# Patient Record
Sex: Male | Born: 1950 | Race: White | Hispanic: No | Marital: Married | Smoking: Current every day smoker
Health system: Southern US, Community
[De-identification: ages and names within clinical notes are randomized; demographics above are authoritative.]

---

## 2014-10-27 ENCOUNTER — Other Ambulatory Visit: Payer: Self-pay | Admitting: Family Medicine

## 2014-10-27 ENCOUNTER — Ambulatory Visit
Admission: RE | Admit: 2014-10-27 | Discharge: 2014-10-27 | Disposition: A | Discharge status: Home / Self Care | Payer: BC Managed Care – PPO | Source: Ambulatory Visit | Attending: Family Medicine | Admitting: Family Medicine

## 2014-10-27 DIAGNOSIS — R05 Cough: Secondary | ICD-10-CM

## 2014-10-27 DIAGNOSIS — R059 Cough, unspecified: Secondary | ICD-10-CM

## 2017-02-05 ENCOUNTER — Telehealth: Payer: Self-pay | Admitting: Acute Care

## 2017-02-05 DIAGNOSIS — F17210 Nicotine dependence, cigarettes, uncomplicated: Principal | ICD-10-CM

## 2017-02-06 NOTE — Telephone Encounter (Signed)
[facility]

## 2017-02-06 NOTE — Telephone Encounter (Signed)
[facility] with pt [facility]22/[facility] 3:00 CT ordered Nothing further needed

## 2017-02-22 ENCOUNTER — Ambulatory Visit (INDEPENDENT_AMBULATORY_CARE_PROVIDER_SITE_OTHER): Payer: BLUE CROSS/BLUE SHIELD | Admitting: Acute Care

## 2017-02-22 ENCOUNTER — Ambulatory Visit (INDEPENDENT_AMBULATORY_CARE_PROVIDER_SITE_OTHER)
Admission: RE | Admit: 2017-02-22 | Discharge: 2017-02-22 | Disposition: A | Discharge status: Home / Self Care | Payer: BLUE CROSS/BLUE SHIELD | Source: Ambulatory Visit | Attending: Acute Care | Admitting: Acute Care

## 2017-02-22 ENCOUNTER — Encounter: Payer: Self-pay | Admitting: Acute Care

## 2017-02-22 DIAGNOSIS — Z87891 Personal history of nicotine dependence: Secondary | ICD-10-CM | POA: Diagnosis not present

## 2017-02-22 DIAGNOSIS — F17210 Nicotine dependence, cigarettes, uncomplicated: Secondary | ICD-10-CM

## 2017-02-22 NOTE — Progress Notes (Signed)
Shared Decision Making Visit Lung Cancer Screening Program [phone])   Eligibility:  Age 66 y.o.  Pack Years Smoking History Calculation 43 pack year (# packs/per year x # years smoked)  Recent History of coughing up blood  no  Unexplained weight loss? no ( >Than 15 pounds within the last 6 months )  Prior History Lung / other cancer no (Diagnosis within the last 5 years already requiring surveillance chest CT Scans).  Smoking Status Current Smoker  Former Smokers: Years since quit: NA  Quit Date: NA  Visit Components:  Discussion included one or more decision making aids. yes  Discussion included risk/benefits of screening. yes  Discussion included potential follow up diagnostic testing for abnormal scans. yes  Discussion included meaning and risk of over diagnosis. yes  Discussion included meaning and risk of False Positives. yes  Discussion included meaning of total radiation exposure. yes  Counseling Included:  Importance of adherence to annual lung cancer LDCT screening. yes  Impact of comorbidities on ability to participate in the program. yes  Ability and willingness to under diagnostic treatment. yes  Smoking Cessation Counseling:  Current Smokers:   Discussed importance of smoking cessation. yes  Information about tobacco cessation classes and interventions provided to patient. yes  Patient provided with "ticket" for LDCT Scan. yes  Symptomatic Patient. no  Counseling  Diagnosis Code: Tobacco Use Z72.0  Asymptomatic Patient yes  Counseling (Intermediate counseling: > three minutes counseling) [id number]  Former Smokers:   Discussed the importance of maintaining cigarette abstinence. yes  Diagnosis Code: Personal History of Nicotine Dependence. Z30.865  Information about tobacco cessation classes and interventions provided to patient. Yes  Patient provided with "ticket" for LDCT Scan. yes  Written Order for Lung Cancer Screening with LDCT  placed in Epic. Yes (CT Chest Lung Cancer Screening Low Dose W/O CM) [id number] Z12.2-Screening of respiratory organs Z87.891-Personal history of nicotine dependence  I have spent 25 minutes of face to face time with Mr. Cragg discussing the risks and benefits of lung cancer screening. We viewed a power point together that explained in detail the above noted topics. We paused at intervals to allow for questions to be asked and answered to ensure understanding.We discussed that the single most powerful action that he can take to decrease his risk of developing lung cancer is to quit smoking. We discussed whether or not he is ready to commit to setting a quit date.He is not ready to commit to a quit date. We discussed options for tools to aid in quitting smoking including nicotine replacement therapy, non-nicotine medications, support groups, Quit Smart classes, and behavior modification. We discussed that often times setting smaller, more achievable goals, such as eliminating 1 cigarette a day for a week and then 2 cigarettes a day for a week can be helpful in slowly decreasing the number of cigarettes smoked. This allows for a sense of accomplishment as well as providing a clinical benefit. I gave him the " Be Stronger Than Your Excuses" card with contact information for community resources, classes, free nicotine replacement therapy, and access to mobile apps, text messaging, and on-line smoking cessation help. I have also offered  Him  my card and contact information in the event he needs to contact me. We discussed the time and location of the scan, and that either Abigail Miyamoto, RN  or I will call with the results within 24-48 hours of receiving them. I have offered him  with a copy of the power point we viewed  as a resource in the event they need reinforcement of the concepts we discussed today in the office. The patient verbalized understanding of all of  the above and had no further questions upon leaving  the office. They have my contact information in the event they have any further questions.  I spent 3 minutes of this visit counseling on smoking cessation.  We discussed that there has been a high incidence of CAD noted on this non-gated exam. I explained that this is a non-gated exam, and degree and severity of disease cannot be determined.He is not taking a statin, and I did explain that we will fax a copy of these results to his PCP for follow up. He verbalized understanding.    Bevelyn Ngo, NP 02/22/2017

## 2017-02-27 ENCOUNTER — Other Ambulatory Visit: Payer: Self-pay | Admitting: Acute Care

## 2017-02-27 DIAGNOSIS — F17210 Nicotine dependence, cigarettes, uncomplicated: Principal | ICD-10-CM

## 2018-03-03 ENCOUNTER — Other Ambulatory Visit: Payer: Self-pay | Admitting: Family Medicine

## 2018-03-03 DIAGNOSIS — I77810 Thoracic aortic ectasia: Secondary | ICD-10-CM

## 2018-03-04 ENCOUNTER — Other Ambulatory Visit: Payer: BLUE CROSS/BLUE SHIELD

## 2018-03-09 IMAGING — CT CT CHEST LUNG CANCER SCREENING LOW DOSE W/O CM
2 of 4 series · 15 of 40 positions shown, 18 images · non-contrast
Comparison: 10/27/2014 chest radiograph.

CLINICAL DATA: 65-year-old asymptomatic male current smoker with 43
pack-year smoking history.

EXAM:
CT CHEST WITHOUT CONTRAST LOW-DOSE FOR LUNG CANCER SCREENING
TECHNIQUE: Multidetector CT imaging of the chest was performed following the
standard protocol without IV contrast.

[Series 2: thorax 5.0 i31f 3 · axial · 0.78mm/px · z∈[-345,-70]mm · 12 of 66 slices shown, 15 images]
[im 6/66  mediastinal]
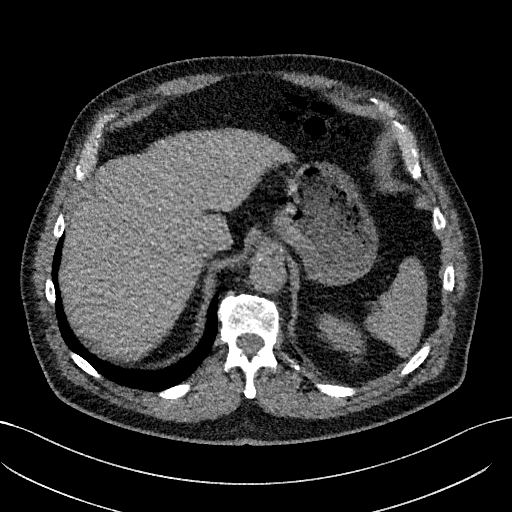
[im 6/66  lung]
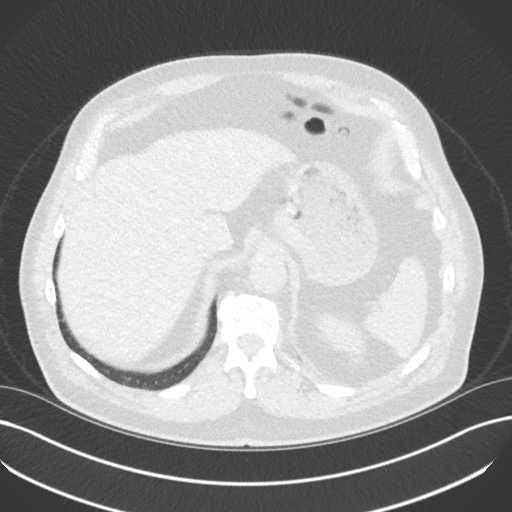
[im 11/66  lung]
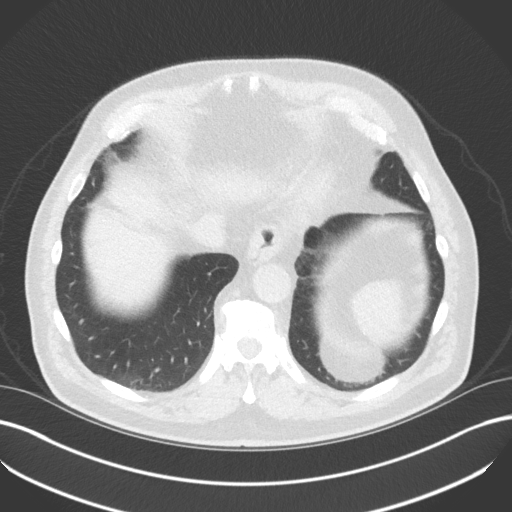
[im 16/66  lung]
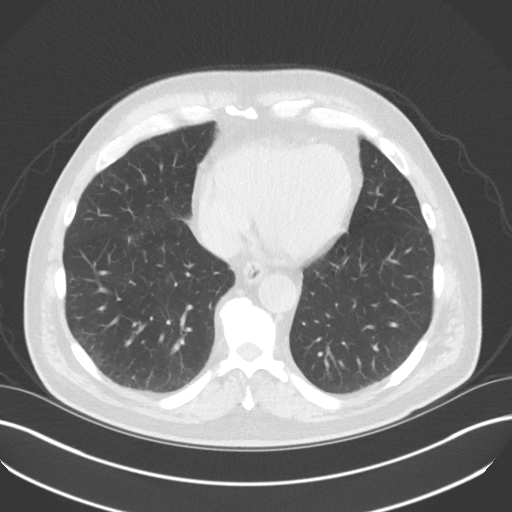
[im 21/66  lung]
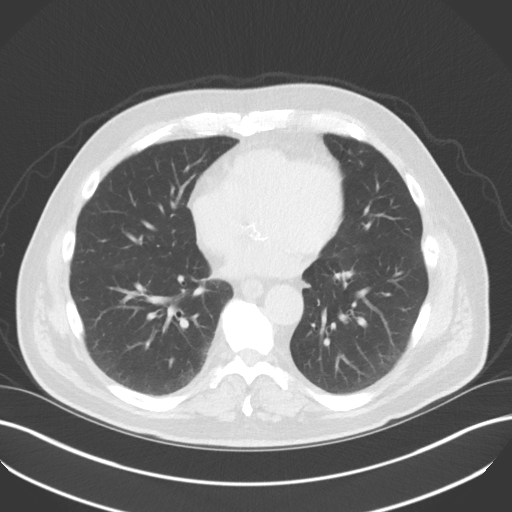
[im 26/66  mediastinal]
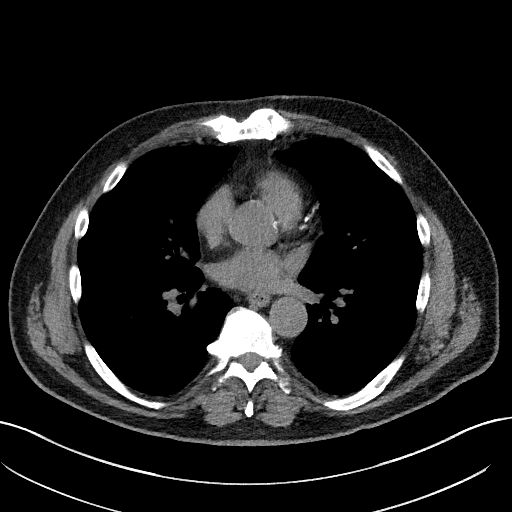
[im 26/66  lung]
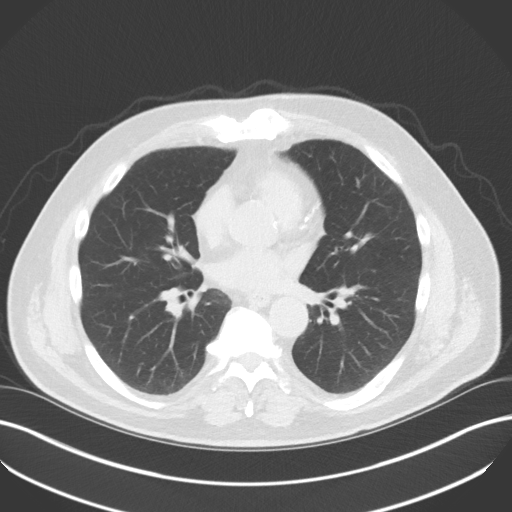
[im 31/66  lung]
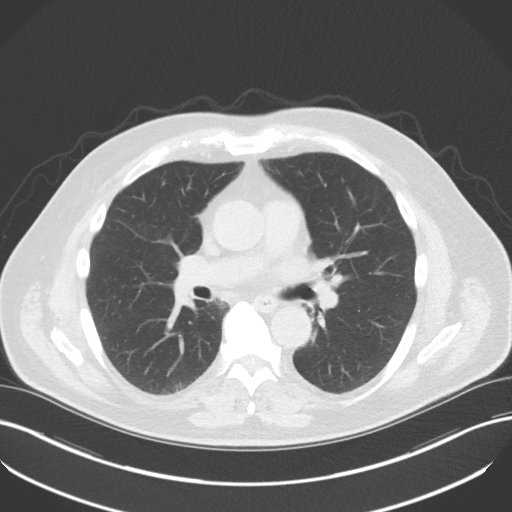
[im 36/66  lung]
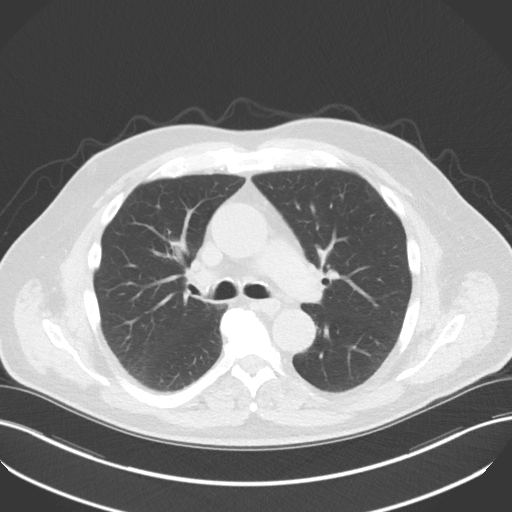
[im 41/66  lung]
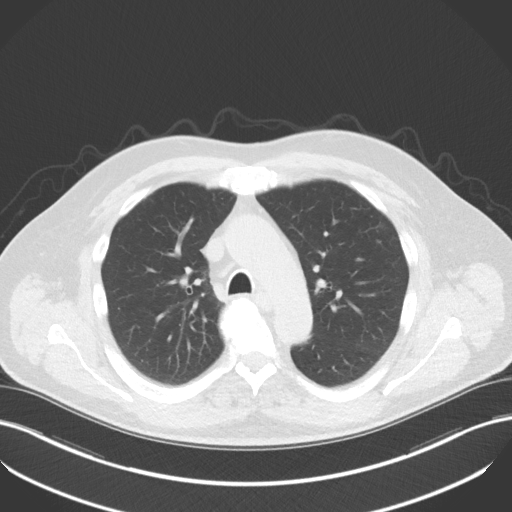
[im 46/66  mediastinal]
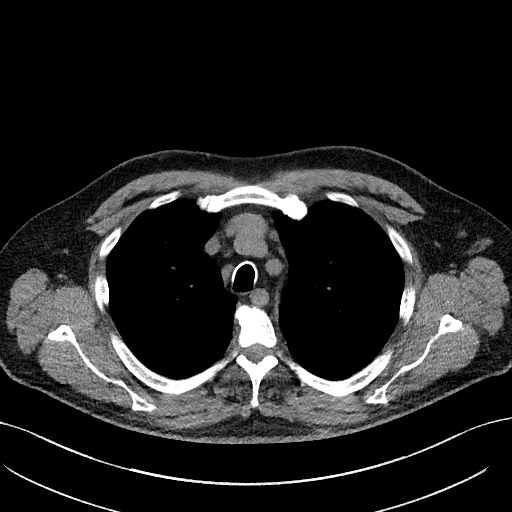
[im 46/66  lung]
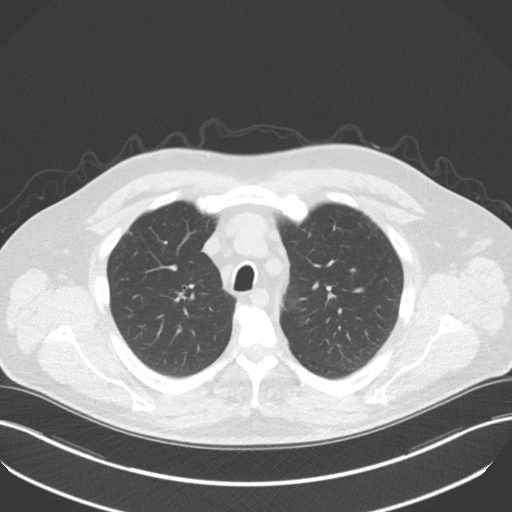
[im 51/66  lung]
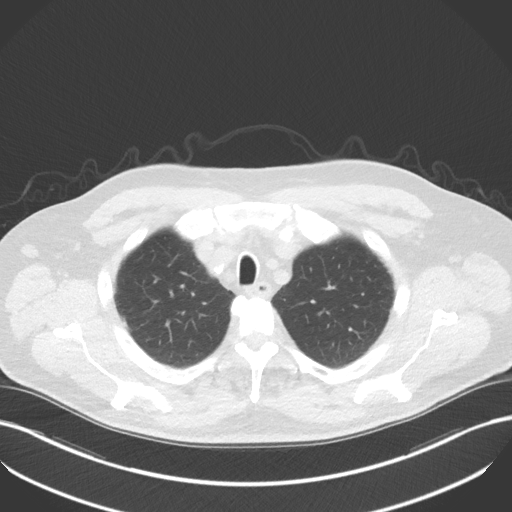
[im 56/66  lung]
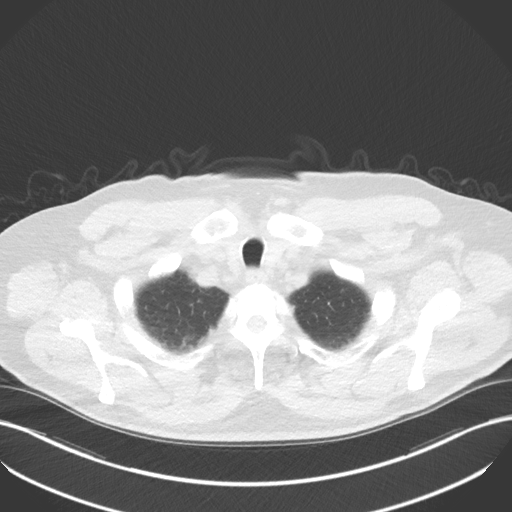
[im 61/66  lung]
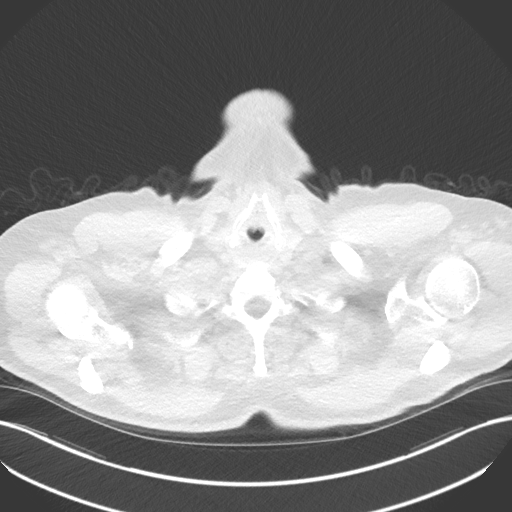

[Series 5: coronal · coronal · 0.67mm/px · 3 of 151 slices shown]
[im 31/151  lung]
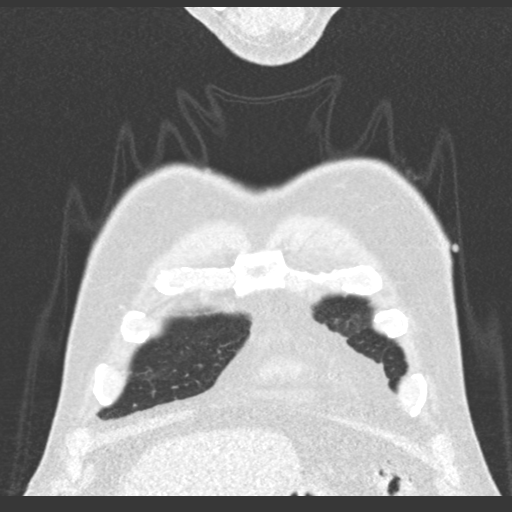
[im 61/151  lung]
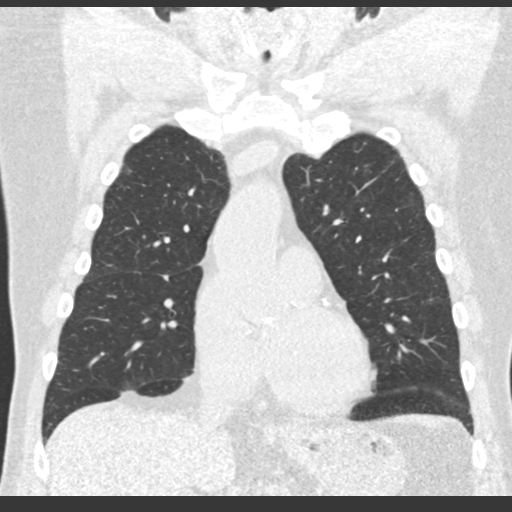
[im 91/151  lung]
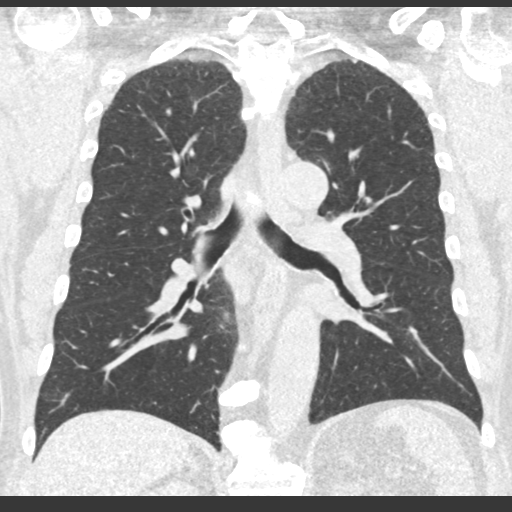

[15 of 40 positions shown; findings below may reference images not displayed]

FINDINGS: Cardiovascular: Normal heart size. No significant pericardial
fluid/thickening. Left anterior descending and right coronary
atherosclerosis. Atherosclerotic thoracic aorta. Ectatic ascending
thoracic aorta with maximum diameter 4.2 cm. Normal caliber
pulmonary arteries.

Mediastinum/Nodes: No discrete thyroid nodules. Unremarkable
esophagus. No pathologically enlarged axillary, mediastinal or gross
hilar lymph nodes, noting limited sensitivity for the detection of
hilar adenopathy on this noncontrast study.

Lungs/Pleura: No pneumothorax. No pleural effusion. There are 2
pulmonary nodules in the right lung, largest measuring 3.3 mm in
volume derived mean diameter in the subpleural anterior right lower
lobe (series 3/ image 184). No acute consolidative airspace disease
or lung masses.

Upper abdomen: Small hiatal hernia.

Musculoskeletal: No aggressive appearing focal osseous lesions.
Subacute to chronic lateral left seventh rib fracture. Moderate
thoracic spondylosis.
IMPRESSION: 1. Lung-RADS Category 2-S, benign appearance or behavior. Continue
annual screening with low-dose chest CT without contrast in 12
months.
2. The "S" modifier above refers to potentially clinically
significant non lung cancer related findings. Specifically,
two-vessel coronary atherosclerosis .
3. Aortic atherosclerosis. Ectatic 4.2 cm ascending thoracic aorta.
Recommend annual imaging followup by CTA or MRA. This recommendation
follows 2919 ACCF/AHA/AATS/ACR/ASA/SCA/HOKIN/RAMIAR/NYA/TIGER Guidelines
for the Diagnosis and Management of Patients with Thoracic Aortic
Disease. Circulation. 2919; 121: [id number].
4. Small hiatal hernia.

## 2018-05-08 ENCOUNTER — Other Ambulatory Visit: Payer: BLUE CROSS/BLUE SHIELD

## 2018-10-13 ENCOUNTER — Other Ambulatory Visit: Payer: Self-pay | Admitting: Family Medicine

## 2018-10-13 DIAGNOSIS — I77810 Thoracic aortic ectasia: Secondary | ICD-10-CM

## 2018-10-20 ENCOUNTER — Ambulatory Visit
Admission: RE | Admit: 2018-10-20 | Discharge: 2018-10-20 | Disposition: A | Discharge status: Home / Self Care | Payer: BLUE CROSS/BLUE SHIELD | Source: Ambulatory Visit | Attending: Family Medicine | Admitting: Family Medicine

## 2018-10-20 DIAGNOSIS — I77810 Thoracic aortic ectasia: Secondary | ICD-10-CM

## 2018-10-20 MED ORDER — IOPAMIDOL (ISOVUE-370) INJECTION 76%
75.0000 mL | Freq: Once | INTRAVENOUS | Status: AC | PRN
  Administered 2018-10-20: 75 mL via INTRAVENOUS

## 2018-12-17 ENCOUNTER — Ambulatory Visit: Payer: Self-pay

## 2018-12-17 ENCOUNTER — Other Ambulatory Visit: Payer: Self-pay | Admitting: Family Medicine

## 2018-12-17 DIAGNOSIS — M25561 Pain in right knee: Secondary | ICD-10-CM

## 2019-02-25 ENCOUNTER — Other Ambulatory Visit: Payer: Self-pay | Admitting: Acute Care

## 2019-02-25 DIAGNOSIS — F17210 Nicotine dependence, cigarettes, uncomplicated: Principal | ICD-10-CM

## 2019-02-25 DIAGNOSIS — Z87891 Personal history of nicotine dependence: Secondary | ICD-10-CM

## 2019-02-25 DIAGNOSIS — Z122 Encounter for screening for malignant neoplasm of respiratory organs: Secondary | ICD-10-CM

## 2019-10-05 ENCOUNTER — Other Ambulatory Visit: Payer: Self-pay | Admitting: *Deleted

## 2019-10-05 DIAGNOSIS — Z122 Encounter for screening for malignant neoplasm of respiratory organs: Secondary | ICD-10-CM

## 2019-10-05 DIAGNOSIS — Z87891 Personal history of nicotine dependence: Secondary | ICD-10-CM

## 2019-10-05 DIAGNOSIS — F17210 Nicotine dependence, cigarettes, uncomplicated: Secondary | ICD-10-CM

## 2019-10-26 ENCOUNTER — Ambulatory Visit (HOSPITAL_BASED_OUTPATIENT_CLINIC_OR_DEPARTMENT_OTHER): Payer: Medicare Other

## 2019-10-27 ENCOUNTER — Other Ambulatory Visit: Payer: Self-pay

## 2019-10-27 ENCOUNTER — Ambulatory Visit (HOSPITAL_BASED_OUTPATIENT_CLINIC_OR_DEPARTMENT_OTHER)
Admission: RE | Admit: 2019-10-27 | Discharge: 2019-10-27 | Disposition: A | Discharge status: Home / Self Care | Payer: Medicare Other | Source: Ambulatory Visit | Attending: Acute Care | Admitting: Acute Care

## 2019-10-27 DIAGNOSIS — Z122 Encounter for screening for malignant neoplasm of respiratory organs: Secondary | ICD-10-CM

## 2019-10-27 DIAGNOSIS — Z87891 Personal history of nicotine dependence: Secondary | ICD-10-CM | POA: Insufficient documentation

## 2019-10-27 DIAGNOSIS — F17210 Nicotine dependence, cigarettes, uncomplicated: Secondary | ICD-10-CM

## 2019-11-02 ENCOUNTER — Other Ambulatory Visit: Payer: Self-pay | Admitting: *Deleted

## 2019-11-02 DIAGNOSIS — Z87891 Personal history of nicotine dependence: Secondary | ICD-10-CM

## 2019-11-02 DIAGNOSIS — F17210 Nicotine dependence, cigarettes, uncomplicated: Secondary | ICD-10-CM

## 2020-01-03 ENCOUNTER — Ambulatory Visit: Payer: Medicare Other | Attending: Internal Medicine
